# Patient Record
Sex: Male | Born: 1970
Health system: Southern US, Community
[De-identification: ages and names within clinical notes are randomized; demographics above are authoritative.]

---

## 2011-10-12 DIAGNOSIS — IMO0002 Reserved for concepts with insufficient information to code with codable children: Secondary | ICD-10-CM | POA: Insufficient documentation

## 2011-11-17 DIAGNOSIS — E782 Mixed hyperlipidemia: Secondary | ICD-10-CM | POA: Insufficient documentation

## 2014-06-14 DIAGNOSIS — K219 Gastro-esophageal reflux disease without esophagitis: Secondary | ICD-10-CM | POA: Insufficient documentation

## 2014-06-14 DIAGNOSIS — Z87891 Personal history of nicotine dependence: Secondary | ICD-10-CM | POA: Insufficient documentation

## 2014-06-20 DIAGNOSIS — I10 Essential (primary) hypertension: Secondary | ICD-10-CM | POA: Insufficient documentation

## 2015-06-05 ENCOUNTER — Encounter: Payer: Self-pay | Admitting: Sports Medicine

## 2015-06-13 ENCOUNTER — Ambulatory Visit (INDEPENDENT_AMBULATORY_CARE_PROVIDER_SITE_OTHER): Payer: BLUE CROSS/BLUE SHIELD | Admitting: Sports Medicine

## 2015-06-13 ENCOUNTER — Ambulatory Visit (INDEPENDENT_AMBULATORY_CARE_PROVIDER_SITE_OTHER): Payer: BLUE CROSS/BLUE SHIELD

## 2015-06-13 VITALS — BP 130/84 | HR 81 | Resp 18 | Wt 231.7 lb

## 2015-06-13 DIAGNOSIS — M5416 Radiculopathy, lumbar region: Secondary | ICD-10-CM

## 2015-06-13 DIAGNOSIS — M7072 Other bursitis of hip, left hip: Secondary | ICD-10-CM | POA: Diagnosis not present

## 2015-06-13 MED ORDER — MELOXICAM 15 MG PO TABS: ORAL_TABLET | ORAL | Status: DC

## 2015-06-13 MED ORDER — PREDNISONE 50 MG PO TABS: ORAL_TABLET | ORAL | Status: DC

## 2015-06-13 NOTE — Progress Notes (Signed)
   Subjective:    I'm seeing this patient as a consultation for:  Dr. Janece CanterburyAaron Boals  CC: Back pain, left buttock pain  HPI: Right-sided back pain: Worse with sitting, flexion, Valsalva with radiation down the leg in an L5 distribution, hasn't present for years on and off. Moderate, persistent with gout constitutional symptoms, or bowel or bladder dysfunction.  Left-sided buttock pain: Worse when sitting, localized over the ischial tuberosity, moderate, persistent.  Past medical history, Surgical history, Family history not pertinant except as noted below, Social history, Allergies, and medications have been entered into the medical record, reviewed, and no changes needed.   Review of Systems: No headache, visual changes, nausea, vomiting, diarrhea, constipation, dizziness, abdominal pain, skin rash, fevers, chills, night sweats, weight loss, swollen lymph nodes, body aches, joint swelling, muscle aches, chest pain, shortness of breath, mood changes, visual or auditory hallucinations.   Objective:   General: Well Developed, well nourished, and in no acute distress.  Neuro/Psych: Alert and oriented x3, extra-ocular muscles intact, able to move all 4 extremities, sensation grossly intact. Skin: Warm and dry, no rashes noted.  Respiratory: Not using accessory muscles, speaking in full sentences, trachea midline.  Cardiovascular: Pulses palpable, no extremity edema. Abdomen: Does not appear distended. Back Exam:  Inspection: Unremarkable  Motion: Flexion 45 deg, Extension 45 deg, Side Bending to 45 deg bilaterally,  Rotation to 45 deg bilaterally  SLR laying: Negative  XSLR laying: Negative  Palpable tenderness: None. FABER: negative. Sensory change: Gross sensation intact to all lumbar and sacral dermatomes.  Reflexes: 2+ at both patellar tendons, 2+ at achilles tendons, Babinski's downgoing.  Strength at foot  Plantar-flexion: 5/5 Dorsi-flexion: 5/5 Eversion: 5/5 Inversion: 5/5  Leg  strength  Quad: 5/5 Hamstring: 5/5 Hip flexor: 5/5 Hip abductors: 5/5  Gait unremarkable. Left Hip: ROM IR: 60 Deg, ER: 60 Deg, Flexion: 120 Deg, Extension: 100 Deg, Abduction: 45 Deg, Adduction: 45 Deg Strength IR: 5/5, ER: 5/5, Flexion: 5/5, Extension: 5/5, Abduction: 5/5, Adduction: 5/5 Pelvic alignment unremarkable to inspection and palpation. Standing hip rotation and gait without trendelenburg / unsteadiness. Greater trochanter without tenderness to palpation. No tenderness over piriformis. No SI joint tenderness and normal minimal SI movement.  Tenderness over the ischial bursa  Procedure: Real-time Ultrasound Guided Injection of left ischial bursa Device: GE Logiq E  Verbal informed consent obtained.  Time-out conducted.  Noted no overlying erythema, induration, or other signs of local infection.  Skin prepped in a sterile fashion.  Local anesthesia: Topical Ethyl chloride.  With sterile technique and under real time ultrasound guidance:  Spinal needle advanced towards the skilled bursa, medication was injected near the ischial tuberosity, and somewhat more proximally in the tissue plane between the gluteus maximus and medius. Completed without difficulty  Pain immediately resolved suggesting accurate placement of the medication.  Advised to call if fevers/chills, erythema, induration, drainage, or persistent bleeding.  Images permanently stored and available for review in the ultrasound unit.  Impression: Technically successful ultrasound guided injection.  Impression and Recommendations:   This case required medical decision making of moderate complexity.

## 2015-06-13 NOTE — Assessment & Plan Note (Signed)
Prednisone, meloxicam, formal physical therapy, x-rays. Return in one month, MRI for interventional planning if no better.

## 2015-06-13 NOTE — Assessment & Plan Note (Signed)
Injection as above, return in one month. 

## 2015-06-19 ENCOUNTER — Ambulatory Visit: Payer: BLUE CROSS/BLUE SHIELD | Admitting: Physical Therapy

## 2015-06-25 ENCOUNTER — Ambulatory Visit (INDEPENDENT_AMBULATORY_CARE_PROVIDER_SITE_OTHER): Payer: BLUE CROSS/BLUE SHIELD | Admitting: Physical Therapy

## 2015-06-25 ENCOUNTER — Encounter: Payer: Self-pay | Admitting: Physical Therapy

## 2015-06-25 DIAGNOSIS — M62830 Muscle spasm of back: Secondary | ICD-10-CM | POA: Diagnosis not present

## 2015-06-25 DIAGNOSIS — M5441 Lumbago with sciatica, right side: Secondary | ICD-10-CM

## 2015-06-25 DIAGNOSIS — M25651 Stiffness of right hip, not elsewhere classified: Secondary | ICD-10-CM

## 2015-06-25 DIAGNOSIS — M6281 Muscle weakness (generalized): Secondary | ICD-10-CM | POA: Diagnosis not present

## 2015-06-25 NOTE — Therapy (Signed)
[facility] [address] [place], [place], [address] Phone: [phone]   Fax:  [phone]  Physical Therapy Evaluation  Patient Details  Name: Andre Frye MRN: [id number] Date of Birth: [birth year] Referring Provider: Dr Benjamin Stainhekkekandam  Encounter Date: 06/25/2015      PT End of Session - 06/25/15 1532    Visit Number 1   Number of Visits 8   Date for PT Re-Evaluation 07/23/15   PT Start Time 1532   PT Stop Time 1645   PT Time Calculation (min) 73 min   Activity Tolerance Patient tolerated treatment well      History reviewed. No pertinent past medical history.  History reviewed. No pertinent past surgical history.  There were no vitals filed for this visit.       Subjective Assessment - 06/25/15 1532    Subjective Pt reports he has had some falls with playing with his son during soccer and developed low back pain. the pain became progressively worse with sitting and leaning forward.  He did have some radicular symptoms into the Rt LE, had a 5 day  dose of prednisone and it helped some.  Then had an injection in the Lt hip, this pain is resolved.  He is now taking mobic and is 80% improved.Continues with low back pain.     How long can you sit comfortably? 15'   How long can you walk comfortably? not limited   Diagnostic tests x-rays show lumbar DDD   Patient Stated Goals get rid of pain, not have it return into his legs and limit his walking and driving.    Currently in Pain? Yes   Pain Score 2    Pain Location Back   Pain Orientation Mid;Lower   Pain Descriptors / Indicators Aching;Dull   Pain Type Acute pain   Pain Onset More than a month ago   Pain Frequency Constant   Aggravating Factors  sitting   Pain Relieving Factors  medication - prednisone            [facility] PT Assessment - 06/25/15 0001    Assessment   Medical Diagnosis Rt lumbar radiculopathy   Referring Provider Dr Benjamin Stainhekkekandam   Onset  Date/Surgical Date 03/28/15   Hand Dominance Right  however Lt side dominant   Next MD Visit 07/12/15   Prior Therapy saw chiropractor 2 yrs ago without relief   Precautions   Precautions None   Balance Screen   Has the patient fallen in the past 6 months Yes   How many times? 1  playing with son   Has the patient had a decrease in activity level because of a fear of falling?  No   Is the patient reluctant to leave their home because of a fear of falling?  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    Leisure gets a massage 2x/month, play with son,    Observation/Other Assessments   Focus on Therapeutic Outcomes (FOTO)  34% limited   Functional Tests   Functional tests Squat;Single leg stance   Squat   Comments WNL   Single Leg Stance   Comments WNL   Posture/Postural Control   Posture/Postural Control Postural limitations   Postural Limitations Increased lumbar lordosis  extrea abdominal girth   Posture Comments good lower body alignment    ROM / Strength  AROM / PROM / Strength AROM;Strength   AROM   Overall AROM Comments hips limited due to soft tissue approximation   AROM Assessment Site Lumbar   Lumbar Flexion WNl   Lumbar Extension WNL   Lumbar - Right Side Bend WNL   Lumbar - Left Side Bend WNL   Lumbar - Right Rotation WNL however developed side spasm   Lumbar - Left Rotation WNL however developed side spasm   Strength   Strength Assessment Site Lumbar;Hip;Knee;Ankle   Right/Left Hip --  bilat WNL   Right/Left Knee --  bilat WNL   Right/Left Ankle --  bilat WNL   Lumbar Flexion --  TA fair   Lumbar Extension --  multifidi poor   Flexibility   Soft Tissue Assessment /Muscle Length yes   Hamstrings Lt 68 degrees, Rt 65 degrees   Palpation   Spinal mobility good spinal mobility CPA & bilat UPA mobs however tender in L5-3   Palpation comment  tightness in Rt lumbar paraspinals and QL with tenderness.   Special Tests    Special Tests --  (-) lumbar special tests                   [facility] Adult PT Treatment/Exercise - 06/25/15 0001    Exercises   Exercises Lumbar   Lumbar Exercises: Stretches   Passive Hamstring Stretch 2 reps;30 seconds  with strap bilat   Lower Trunk Rotation --  10 reps to each side   Lumbar Exercises: Supine   Ab Set 10 reps;5 seconds  VC required   Lumbar Exercises: Prone   Other Prone Lumbar Exercises 10 reps pelvic press, VC required.    Modalities   Modalities Electrical Stimulation;Moist Heat   Moist Heat Therapy   Number Minutes Moist Heat 15 Minutes   Moist Heat Location Lumbar Spine   Electrical Stimulation   Electrical Stimulation Location lumbar   Electrical Stimulation Action IFC   Electrical Stimulation Parameters  to tolerance   Electrical Stimulation Goals Pain;Tone                PT Education - 06/25/15 1640    Education provided Yes   Education Details HEP, TDN   Person(s) Educated Patient   Methods Explanation;Demonstration;Handout   Comprehension Need further instruction;Verbalized understanding             PT Long Term Goals - 06/25/15 1654    PT LONG TERM GOAL #1   Title I with advanced HEP for core stability ( 07/23/15)    Time 4   Period Weeks   Status New   PT LONG TERM GOAL #2   Title demo good contraction of TA and multifidi ( 07/23/15)    Time 4   Period Weeks   Status New   PT LONG TERM GOAL #3   Title report pain decrease =/> 75% with sitting ( 07/23/15)    Time 4   Period Weeks   Status New   PT LONG TERM GOAL #4   Title improved FOTO =/< 23% limited ( 07/23/15)    Time 4   Period Weeks   Status New               Plan - 06/25/15 1640    Clinical Impression Statement 45 yo male with Rt radicular LBP for 4-5 months after falling presents with core weakness, hamstring tightness, increased lumbar lordosis and tightness in  his back musculature. 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 He had difficulty engaging his deep core muscles with therapeutic exercise.    Rehab Potential Good   PT Frequency 2x / week   PT Duration 4 weeks   PT Treatment/Interventions Ultrasound;Neuromuscular re-education;Patient/family education;Cryotherapy;Dry needling;Electrical Stimulation;Moist Heat;Therapeutic exercise;Manual techniques   PT Next Visit Plan progress core ex, TDN Rt OL/lumbar PRN      Patient will benefit from skilled therapeutic intervention in order to improve the following deficits and impairments:  Postural dysfunction, Decreased strength, Pain, Obesity, Increased muscle spasms  Visit Diagnosis: Midline low back pain with right-sided sciatica - Plan: PT plan of care cert/re-cert  Muscle weakness (generalized) - Plan: PT plan of care cert/re-cert  Stiffness of right hip, not elsewhere classified - Plan: PT plan of care cert/re-cert  Muscle spasm of back - Plan: PT plan of care cert/re-cert     Problem List Patient Active Problem List   Diagnosis Date Noted  . Right lumbar radiculopathy 06/13/2015  . Ischial bursitis of left side 06/13/2015    Roderic Scarce PT 06/25/2015, 4:58 PM  [facility] [address] [place], [place], [address] Phone: [phone]   Fax:  [phone]  Name: Andre Frye MRN: [id number] Date of Birth: 01-Jan-1971

## 2015-06-25 NOTE — Patient Instructions (Signed)
Supine: Leg Stretch with Strap (Super Advanced)    Lie on back with one leg straight. Hook strap around other foot. Straighten knee. Raise leg to maximal stretch and straighten knee further by tightening quadriceps. Slowly press other leg down as close to floor as possible. Keep lower abdominals tight. Hold _30__ seconds or more. Warning: Intense stretch. Stay within tolerance. Repeat _2__ times per session. Do __2_ sessions per day. Repeat on the other leg.   Lower Trunk Rotation Stretch    Keeping back flat and feet together, rotate knees to left side. Hold _3-5___ seconds. Repeat _10___ times per set. Do __1__ sets per session. Do __2__ sessions per day.  Pelvic Tilt    Flatten back by tightening stomach muscles and hold 5 sec. Repeat __10__ times per set. Do _1___ sets per session. Do _2___ sessions per day.   Pelvic Press    Place hands under belly between navel and pubic bone, palms up. Feel pressure on hands. Increase pressure on hands by pressing pelvis down. This is NOT a pelvic tilt. Hold _5__ seconds. Relax. Repeat _10__ times. 2 times a day.   Copyright  VHI. All rights reserved.   Trigger Point Dry Needling  . What is Trigger Point Dry Needling (DN)? o DN is a physical therapy technique used to treat muscle pain and dysfunction. Specifically, DN helps deactivate muscle trigger points (muscle knots).  o A thin filiform needle is used to penetrate the skin and stimulate the underlying trigger point. The goal is for a local twitch response (LTR) to occur and for the trigger point to relax. No medication of any kind is injected during the procedure.   . What Does Trigger Point Dry Needling Feel Like?  o The procedure feels different for each individual patient. Some patients report that they do not actually feel the needle enter the skin and overall the process is not painful. Very mild bleeding may occur. However, many patients feel a deep cramping in the muscle in  which the needle was inserted. This is the local twitch response.   Marland Kitchen. How Will I feel after the treatment? o Soreness is normal, and the onset of soreness may not occur for a few hours. Typically this soreness does not last longer than two days.  o Bruising is uncommon, however; ice can be used to decrease any possible bruising.  o In rare cases feeling tired or nauseous after the treatment is normal. In addition, your symptoms may get worse before they get better, this period will typically not last longer than 24 hours.   . What Can I do After My Treatment? o Increase your hydration by drinking more water for the next 24 hours. o You may place ice or heat on the areas treated that have become sore, however, do not use heat on inflamed or bruised areas. Heat often brings more relief post needling. o You can continue your regular activities, but vigorous activity is not recommended initially after the treatment for 24 hours. o DN is best combined with other physical therapy such as strengthening, stretching, and other therapies.

## 2015-06-26 ENCOUNTER — Ambulatory Visit: Payer: Self-pay | Admitting: Rehabilitative and Restorative Service Providers"

## 2015-07-02 ENCOUNTER — Ambulatory Visit (INDEPENDENT_AMBULATORY_CARE_PROVIDER_SITE_OTHER): Payer: BLUE CROSS/BLUE SHIELD | Admitting: Physical Therapy

## 2015-07-02 DIAGNOSIS — M5441 Lumbago with sciatica, right side: Secondary | ICD-10-CM

## 2015-07-02 DIAGNOSIS — M6281 Muscle weakness (generalized): Secondary | ICD-10-CM | POA: Diagnosis not present

## 2015-07-02 DIAGNOSIS — M25651 Stiffness of right hip, not elsewhere classified: Secondary | ICD-10-CM | POA: Diagnosis not present

## 2015-07-02 DIAGNOSIS — M62830 Muscle spasm of back: Secondary | ICD-10-CM | POA: Diagnosis not present

## 2015-07-02 NOTE — Patient Instructions (Addendum)
Outer Hip Stretch: Reclined IT Band Stretch (Strap)  [place] [phone]    Strap around right foot, lift leg up then pull across only as far as possible with shoulders on mat. Rotate the hips Hold for _30-45_ secs. Repeat __2__ times each leg.  Copyright  VHI. All rights reserved.                                TENS UNIT: This is helpful for muscle pain and spasm.   Search and Purchase a TENS 7000 2nd edition at www.tenspros.com. It should be less than $30.     TENS unit instructions: Do not shower or bathe with the unit on Turn the unit off before removing electrodes or batteries If the electrodes lose stickiness add a drop of water to the electrodes after they are disconnected from the unit and place on plastic sheet. If you continued to have difficulty, call the TENS unit company to purchase more electrodes. Do not apply lotion on the skin area prior to use. Make sure the skin is clean and dry as this will help prolong the life of the electrodes. After use, always check skin for unusual red areas, rash or other skin difficulties. If there are any skin problems, does not apply electrodes to the same area. Never remove the electrodes from the unit by pulling the wires. Do not use the TENS unit or electrodes other than as directed. Do not change electrode placement without consultating your therapist or physician. Keep 2 fingers with between each electrode. Wear time ratio is 2:1, on to off times.    For example on for 30 minutes off for 15 minutes and then on for 30 minutes off for 15 minutes

## 2015-07-02 NOTE — Therapy (Signed)
[facility] [address] [place], [place], [address] Phone: [phone]   Fax:  [phone]  Physical Therapy Treatment  Patient Details  Name: Andre Frye MRN: [id number] Date of Birth: 19-Jan-1971 Referring Provider: Dr Benjamin Stain  Encounter Date: 07/02/2015      PT End of Session - 07/02/15 1608    Visit Number 2   Number of Visits 8   Date for PT Re-Evaluation 07/23/15   PT Start Time 1603   PT Stop Time 1652   PT Time Calculation (min) 49 min   Activity Tolerance Patient tolerated treatment well      No past medical history on file.  No past surgical history on file.  There were no vitals filed for this visit.      Subjective Assessment - 07/02/15 1606    Subjective Pt reports he is doing ok, , no trouble with HEP. Did a lot of swimming, walking on the beach   Patient Stated Goals get rid of pain, not have it return into his legs and limit his walking and driving.    Currently in Pain? Yes   Pain Score 2    Pain Location Back   Pain Orientation Right   Pain Descriptors / Indicators Aching   Pain Type Acute pain   Pain Onset More than a month ago   Pain Frequency Constant   Aggravating Factors  sitting - however getting better   Pain Relieving Factors medication                          [facility] Adult PT Treatment/Exercise - 07/02/15 0001    Lumbar Exercises: Stretches   Single Knee to Chest Stretch 1 rep;20 seconds  each side   ITB Stretch --  cross body with strap Rt LE   Lumbar Exercises: Aerobic   Stationary Bike L2x5'   Lumbar Exercises: Supine   Isometric Hip Flexion 10 reps;5 seconds  same side and opposite side.    Lumbar Exercises: Prone   Opposite Arm/Leg Raise Right arm/Left leg;Left arm/Right leg;10 reps  with pelvic press   Other Prone Lumbar Exercises 10 reps knee flex/ext with pelvic press   Modalities   Modalities Electrical Stimulation;Moist Heat   Moist  Heat Therapy   Number Minutes Moist Heat 15 Minutes   Moist Heat Location Lumbar Spine   Electrical Stimulation   Electrical Stimulation Location lumbar   Electrical Stimulation Action IFC   Electrical Stimulation Parameters  to tolerance   Electrical Stimulation Goals Pain;Tone   Manual Therapy   Manual Therapy Soft tissue mobilization;Joint mobilization   Joint Mobilization lumbar spine L3-5 CPA Rt UPA grade III+   Soft tissue mobilization Rt lower back L5 area then ball release work to the same area.                      PT Long Term Goals - 07/02/15 1625    PT LONG TERM GOAL #1   Title I with advanced HEP for core stability ( 07/23/15)    Time 4   Period Weeks   Status On-going   PT LONG TERM GOAL #2   Title demo good contraction of TA and multifidi ( 07/23/15)    Time 4   Period Weeks   Status On-going   PT LONG TERM GOAL #3   Title report pain decrease =/> 75% with sitting ( 07/23/15)    Time 4  Period Weeks   Status On-going   PT LONG TERM GOAL #4   Title improved FOTO =/< 23% limited ( 07/23/15)    Time 4   Period Weeks   Status On-going               Plan - 07/02/15 1642    Clinical Impression Statement This is Ananth's second visit.  He has some reduction in pain however still has discomfort. He has difficulty cordinating some of the core exercise when there is a focus on specific muscles.    Rehab Potential Good   PT Frequency 2x / week   PT Duration 4 weeks   PT Treatment/Interventions Ultrasound;Neuromuscular re-education;Patient/family education;Cryotherapy;Dry needling;Electrical Stimulation;Moist Heat;Therapeutic exercise;Manual techniques   PT Next Visit Plan progress core   Consulted and Agree with Plan of Care Patient      Patient will benefit from skilled therapeutic intervention in order to improve the following deficits and impairments:  Postural dysfunction, Decreased strength, Pain, Obesity, Increased muscle spasms  Visit  Diagnosis: Muscle spasm of back  Stiffness of right hip, not elsewhere classified  Muscle weakness (generalized)  Midline low back pain with right-sided sciatica     Problem List Patient Active Problem List   Diagnosis Date Noted  . Right lumbar radiculopathy 06/13/2015  . Ischial bursitis of left side 06/13/2015    Roderic ScarceSusan Mekiah Wahler PT 07/02/2015, 4:47 PM  [facility] [address] [place], [place], [address] Phone: [phone]   Fax:  [phone]  Name: Andre Frye MRN: [id number] Date of Birth: September 09, 1970

## 2015-07-03 ENCOUNTER — Encounter: Payer: Self-pay | Admitting: Rehabilitative and Restorative Service Providers"

## 2015-07-03 ENCOUNTER — Ambulatory Visit (INDEPENDENT_AMBULATORY_CARE_PROVIDER_SITE_OTHER): Payer: BLUE CROSS/BLUE SHIELD | Admitting: Rehabilitative and Restorative Service Providers"

## 2015-07-03 DIAGNOSIS — M6281 Muscle weakness (generalized): Secondary | ICD-10-CM | POA: Diagnosis not present

## 2015-07-03 DIAGNOSIS — M5441 Lumbago with sciatica, right side: Secondary | ICD-10-CM

## 2015-07-03 DIAGNOSIS — M25651 Stiffness of right hip, not elsewhere classified: Secondary | ICD-10-CM | POA: Diagnosis not present

## 2015-07-03 DIAGNOSIS — M62830 Muscle spasm of back: Secondary | ICD-10-CM | POA: Diagnosis not present

## 2015-07-03 NOTE — Therapy (Signed)
[facility] [place] [address] [place], [place], [address] Phone: [phone]   Fax:  [phone]  Physical Therapy Treatment  Patient Details  Name: Andre Frye MRN: [id number] Date of Birth: January 26, 1971 Referring Provider: Dr Benjamin Stain  Encounter Date: 07/03/2015      PT End of Session - 07/03/15 1543    Visit Number 3   Number of Visits 8   Date for PT Re-Evaluation 07/23/15   PT Start Time 1539  pt arrived 8 min late for appt   PT Stop Time 1624   PT Time Calculation (min) 45 min   Activity Tolerance Patient tolerated treatment well      History reviewed. No pertinent past medical history.  History reviewed. No pertinent past surgical history.  There were no vitals filed for this visit.      Subjective Assessment - 07/03/15 1542    Subjective continued improvement - less pain   Currently in Pain? No/denies   Pain Score 2    Pain Location Back   Pain Orientation Right   Pain Descriptors / Indicators Aching   Pain Type Acute pain   Pain Onset More than a month ago   Pain Frequency Intermittent                         [facility] Adult PT Treatment/Exercise - 07/03/15 0001    Exercises   Exercises Lumbar   Lumbar Exercises: Stretches   Passive Hamstring Stretch 2 reps;30 seconds  with strap; bilat    ITB Stretch --  cross body with strap Rt LE   Lumbar Exercises: Aerobic   Stationary Bike Nustep L5 x 5 min    Lumbar Exercises: Supine   Ab Set --  attempted 3 part core - difficulty coordinating/breathing   Clam 10 reps  working on Scientist, water quality Knee Raise 10 reps  with core engaged   Lumbar Exercises: Prone   Opposite Arm/Leg Raise Right arm/Left leg;Left arm/Right leg;10 reps  with pelvic press   Other Prone Lumbar Exercises 10 reps knee flex/ext with pelvic press   Modalities   Modalities Electrical Stimulation;Moist Heat   Moist Heat Therapy   Number Minutes Moist Heat  15 Minutes   Moist Heat Location Lumbar Spine   Electrical Stimulation   Electrical Stimulation Location lumbar   Electrical Stimulation Action IFC   Electrical Stimulation Parameters to tolerance   Electrical Stimulation Goals Pain;Tone   Manual Therapy   Manual Therapy Soft tissue mobilization;Joint mobilization   Joint Mobilization lumbar spine L3-5 CPA Rt UPA grade III+   Soft tissue mobilization Rt lower back L5 area then ball release work to the same area.                      PT Long Term Goals - 07/02/15 1625    PT LONG TERM GOAL #1   Title I with advanced HEP for core stability ( 07/23/15)    Time 4   Period Weeks   Status On-going   PT LONG TERM GOAL #2   Title demo good contraction of TA and multifidi ( 07/23/15)    Time 4   Period Weeks   Status On-going   PT LONG TERM GOAL #3   Title report pain decrease =/> 75% with sitting ( 07/23/15)    Time 4   Period Weeks   Status On-going   PT LONG TERM GOAL #4  Title improved FOTO =/< 23% limited ( 07/23/15)    Time 4   Period Weeks   Status On-going               Plan - 07/03/15 1544    Clinical Impression Statement Good response to initial treatments. Decreasing pain and improving mobility and activity level. Some continued difficulty with core stability/strength. Tends to hold his breath. Progressing well toward stated goals of therapy.    Rehab Potential Good   PT Frequency 2x / week   PT Duration 4 weeks   PT Treatment/Interventions Ultrasound;Neuromuscular re-education;Patient/family education;Cryotherapy;Dry needling;Electrical Stimulation;Moist Heat;Therapeutic exercise;Manual techniques   PT Next Visit Plan progress core   Consulted and Agree with Plan of Care Patient      Patient will benefit from skilled therapeutic intervention in order to improve the following deficits and impairments:  Postural dysfunction, Decreased strength, Pain, Obesity, Increased muscle spasms  Visit  Diagnosis: Muscle spasm of back  Stiffness of right hip, not elsewhere classified  Muscle weakness (generalized)  Midline low back pain with right-sided sciatica     Problem List Patient Active Problem List   Diagnosis Date Noted  . Right lumbar radiculopathy 06/13/2015  . Ischial bursitis of left side 06/13/2015    Andre Frye PT, MPH  07/03/2015, 4:19 PM  [facility] [address] [place], [place], [address] Phone: [phone]   Fax:  [phone]  Name: Andre Frye MRN: [id number] Date of Birth: 1970/09/01

## 2015-07-08 ENCOUNTER — Ambulatory Visit (INDEPENDENT_AMBULATORY_CARE_PROVIDER_SITE_OTHER): Payer: BLUE CROSS/BLUE SHIELD | Admitting: Physical Therapy

## 2015-07-08 DIAGNOSIS — M5441 Lumbago with sciatica, right side: Secondary | ICD-10-CM | POA: Diagnosis not present

## 2015-07-08 DIAGNOSIS — M6281 Muscle weakness (generalized): Secondary | ICD-10-CM | POA: Diagnosis not present

## 2015-07-08 DIAGNOSIS — M62830 Muscle spasm of back: Secondary | ICD-10-CM | POA: Diagnosis not present

## 2015-07-08 DIAGNOSIS — M25651 Stiffness of right hip, not elsewhere classified: Secondary | ICD-10-CM

## 2015-07-08 NOTE — Therapy (Signed)
[place], [place], [address] Phone: [phone]   Fax:  [phone]  Physical Therapy Treatment  Patient Details  Name: Andre Frye MRN: [id number] Date of Birth: [birth year] Referring Provider: Dr. Helane Rima  Encounter Date: 07/08/2015      PT End of Session - 07/08/15 0853    Visit Number 4   Number of Visits 8   Date for PT Re-Evaluation 07/23/15   PT Start Time 0849   PT Stop Time 0944   PT Time Calculation (min) 55 min      No past medical history on file.  No past surgical history on file.  There were no vitals filed for this visit.      Subjective Assessment - 07/08/15 0855    Subjective Pt reports he was putting away boxes at restaurant and had increased back pain (up to 4/10), resolved with sleep.    Pt has ordered a TENS unit, has not arrived yet.    Patient Stated Goals get rid of pain, not have it return into his legs and limit his walking and driving.    Currently in Pain? No/denies            [facility] PT Assessment - 07/08/15 0001    Assessment   Medical Diagnosis Rt lumbar radiculopathy   Referring Provider Dr. Helane Rima   Onset Date/Surgical Date 03/28/15   Hand Dominance Right   Next MD Visit 07/11/15   Prior Therapy saw chiropractor 2 yrs ago without relief   Flexibility   Hamstrings Rt 75 deg, Lt 60 deg          [facility] Adult PT Treatment/Exercise - 07/08/15 0001    Lumbar Exercises: Stretches   Passive Hamstring Stretch 2 reps;30 seconds  with strap; bilat    ITB Stretch 2 reps;30 seconds  each leg, tactile cues for improved form.    Lumbar Exercises: Aerobic   Stationary Bike Nustep L5 x 5 min (arms and legs)   Lumbar Exercises: Standing   Other Standing Lumbar Exercises sit to stand with core engaged x 3 reps;  Standing arms out/in to center with 5# weight and core engaged x 5 reps (VC for breath and form)    Lumbar Exercises: Supine   Ab Set 10  reps;5 seconds  counting out loud to encourage breath   AB Set Limitations tactile/VC for proper engagement   Clam 10 reps  working on engaging core   Bent Knee Raise 10 reps  with core engaged   Bent Knee Raise Limitations (demonstration provided for improved form.)   Isometric Hip Flexion 5 seconds;15 reps  same side and opposite side.    Isometric Hip Flexion Limitations VC for breathing during exertion   Modalities   Modalities Electrical Stimulation   Moist Heat Therapy   Number Minutes Moist Heat 15 Minutes   Moist Heat Location Lumbar Spine   Electrical Stimulation   Electrical Stimulation Location Lumbar paraspinals    Electrical Stimulation Action IFC   Electrical Stimulation Parameters to tolerance    Electrical Stimulation Goals Pain;Tone                     PT Long Term Goals - 07/08/15 0919    PT LONG TERM GOAL #1   Title I with advanced HEP for core stability ( 07/23/15)    Time 4   Period Weeks   Status On-going   PT LONG TERM GOAL #2  Title demo good contraction of TA and multifidi ( 07/23/15)    Time 4   Period Weeks   Status On-going   PT LONG TERM GOAL #3   Title report pain decrease =/> 75% with sitting ( 07/23/15)    Time 4   Period Weeks   Status Partially Met  70% reduction reported 07/08/15   PT LONG TERM GOAL #4   Title improved FOTO =/< 23% limited ( 07/23/15)    Time 4   Period Weeks   Status On-going               Plan - 07/08/15 0917    Clinical Impression Statement Pt demonstrated improved transverse abd engagement with demonstration and tactile cues.  VC to encourage breathing through exercise.  Decreased pain with sitting; partially met LTG # 3. Improving hamstring flexibility.  Progressing towards goals.     Rehab Potential Good   PT Frequency 2x / week   PT Duration 4 weeks   PT Treatment/Interventions Ultrasound;Neuromuscular re-education;Patient/family education;Cryotherapy;Dry needling;Electrical  Stimulation;Moist Heat;Therapeutic exercise;Manual techniques   PT Next Visit Plan progress core. MD note - pt returns 07/11/15   Consulted and Agree with Plan of Care Patient      Patient will benefit from skilled therapeutic intervention in order to improve the following deficits and impairments:  Postural dysfunction, Decreased strength, Pain, Obesity, Increased muscle spasms  Visit Diagnosis: Muscle spasm of back  Stiffness of right hip, not elsewhere classified  Muscle weakness (generalized)  Midline low back pain with right-sided sciatica     Problem List Patient Active Problem List   Diagnosis Date Noted  . Right lumbar radiculopathy 06/13/2015  . Ischial bursitis of left side 06/13/2015   Kerin Perna, PTA 07/08/2015 9:31 AM  [facility] [place], [place], [address] Phone: [phone]   Fax:  [phone]  Name: Andre Frye MRN: [id number] Date of Birth: 05-Jul-1970

## 2015-07-10 ENCOUNTER — Ambulatory Visit (INDEPENDENT_AMBULATORY_CARE_PROVIDER_SITE_OTHER): Payer: BLUE CROSS/BLUE SHIELD | Admitting: Physical Therapy

## 2015-07-10 ENCOUNTER — Encounter: Payer: Self-pay | Admitting: Physical Therapy

## 2015-07-10 DIAGNOSIS — M62830 Muscle spasm of back: Secondary | ICD-10-CM

## 2015-07-10 DIAGNOSIS — M5441 Lumbago with sciatica, right side: Secondary | ICD-10-CM | POA: Diagnosis not present

## 2015-07-10 DIAGNOSIS — M6281 Muscle weakness (generalized): Secondary | ICD-10-CM

## 2015-07-10 DIAGNOSIS — M25651 Stiffness of right hip, not elsewhere classified: Secondary | ICD-10-CM

## 2015-07-10 NOTE — Therapy (Addendum)
[place] [address] [place], [place], [address] Phone: [phone]   Fax:  [phone]  Physical Therapy Treatment  Patient Details  Name: Andre Frye MRN: [id number] Date of Birth: 07-Aug-1970 Referring Provider: Dr Dianah Field  Encounter Date: 07/10/2015      PT End of Session - 07/10/15 0856    Visit Number 5   Number of Visits 8   Date for PT Re-Evaluation 07/23/15   PT Start Time 0853   PT Stop Time 0941   PT Time Calculation (min) 48 min   Activity Tolerance Patient tolerated treatment well      History reviewed. No pertinent past medical history.  History reviewed. No pertinent past surgical history.  There were no vitals filed for this visit.      Subjective Assessment - 07/10/15 0855    Subjective Pt sees the MD tomorrow, still having Rt sided low back pain    Currently in Pain? Yes   Pain Score 3    Pain Location Back   Pain Orientation Right   Pain Descriptors / Indicators Aching   Pain Type Acute pain   Pain Onset More than a month ago   Pain Frequency Intermittent            [facility] PT Assessment - 07/10/15 0001    Assessment   Medical Diagnosis Rt lumbar radiculopathy   Referring Provider Dr Dianah Field   Onset Date/Surgical Date 03/28/15   Next MD Visit 07/11/15   Flexibility   Hamstrings Rt 75 deg, Lt 60 deg   Palpation   Palpation comment tightness in Rt QL, gluts and low back                     [facility] Adult PT Treatment/Exercise - 07/10/15 0001    Lumbar Exercises: Stretches   Single Knee to Chest Stretch 5 reps  5 sec    Double Knee to Chest Stretch 1 rep;10 seconds   Lower Trunk Rotation 5 reps   Lumbar Exercises: Aerobic   Stationary Bike Nustep L5 x 5 min (arms and legs)   Modalities   Modalities Electrical Stimulation;Moist Heat   Moist Heat Therapy   Number Minutes Moist Heat 15 Minutes   Moist Heat Location Lumbar Spine   Electrical  Stimulation   Electrical Stimulation Location Lumbar paraspinals   and Rt gluts   Electrical Stimulation Action IFC   Electrical Stimulation Parameters to tolerance   Electrical Stimulation Goals Pain;Tone   Manual Therapy   Manual Therapy Soft tissue mobilization   Soft tissue mobilization Rt low back, gluts, piriformis, tightness decreased with work          Trigger [employer] Needling - 07/10/15 0905    Consent Given? Yes   Education Handout Provided Yes   Muscles Treated Upper Body Quadratus Lumborum;Gluteus minimus;Gluteus maximus;Piriformis  Rt sdie with good twtich response                   PT Long Term Goals - 07/08/15 0919    PT LONG TERM GOAL #1   Title I with advanced HEP for core stability ( 07/23/15)    Time 4   Period Weeks   Status On-going   PT LONG TERM GOAL #2   Title demo good contraction of TA and multifidi ( 07/23/15)    Time 4   Period Weeks   Status On-going   PT LONG TERM GOAL #3   Title report pain  decrease =/> 75% with sitting ( 07/23/15)    Time 4   Period Weeks   Status Partially Met  70% reduction reported 07/08/15   PT LONG TERM GOAL #4   Title improved FOTO =/< 23% limited ( 07/23/15)    Time 4   Period Weeks   Status On-going               Plan - 07/10/15 0926    Clinical Impression Statement Pt making slow progress with pain reduction.  He has good response to TDN today in the Rt low back/buttocks.  His pain was decreased after this and he had increased mobility.  Hamstrings are slowly lenghtening. He would benefti from a couple more treatments to further reduce ms spasms and learn core exercise. He has difficulty with these.    Rehab Potential Good   PT Frequency 2x / week   PT Duration 4 weeks   PT Treatment/Interventions Ultrasound;Neuromuscular re-education;Patient/family education;Cryotherapy;Dry needling;Electrical Stimulation;Moist Heat;Therapeutic exercise;Manual techniques   PT Next Visit Plan assess response  to TDN   Consulted and Agree with Plan of Care Patient      Patient will benefit from skilled therapeutic intervention in order to improve the following deficits and impairments:  Postural dysfunction, Decreased strength, Pain, Obesity, Increased muscle spasms  Visit Diagnosis: Muscle spasm of back  Stiffness of right hip, not elsewhere classified  Muscle weakness (generalized)  Midline low back pain with right-sided sciatica     Problem List Patient Active Problem List   Diagnosis Date Noted  . Right lumbar radiculopathy 06/13/2015  . Ischial bursitis of left side 06/13/2015    Jeral Pinch PT  07/10/2015, 9:29 AM  [facility] [place], [place], [address] Phone: [phone]   Fax:  [phone]  Name: Andre Frye MRN: [id number] Date of Birth: [birth year]    PHYSICAL THERAPY DISCHARGE SUMMARY  Visits from Start of Care: 5  Current functional level related to goals / functional outcomes: unknown   Remaining deficits: unknown   Education / Equipment: Initial HEP  Plan:                                                    Patient goals were not met. Patient is being discharged due to not returning since the last visit.  ?????     Jeral Pinch, PT 08/07/2015 10:11 AM

## 2015-07-11 ENCOUNTER — Ambulatory Visit (INDEPENDENT_AMBULATORY_CARE_PROVIDER_SITE_OTHER): Payer: BLUE CROSS/BLUE SHIELD | Admitting: Sports Medicine

## 2015-07-11 VITALS — BP 127/83 | HR 75 | Resp 18 | Wt 228.4 lb

## 2015-07-11 DIAGNOSIS — M7072 Other bursitis of hip, left hip: Secondary | ICD-10-CM

## 2015-07-11 DIAGNOSIS — M5416 Radiculopathy, lumbar region: Secondary | ICD-10-CM | POA: Diagnosis not present

## 2015-07-11 MED ORDER — PREDNISONE 10 MG (21) PO TBPK: ORAL_TABLET | ORAL | Status: DC

## 2015-07-11 NOTE — Assessment & Plan Note (Signed)
Completely resolved after injection 

## 2015-07-11 NOTE — Progress Notes (Signed)
  Subjective:    CC: Followup  HPI: Ischial bursitis: Resolved after injection.  Low back pain: Improved approximately 70% with prednisone, meloxicam, physical therapy. Still has a pain when sitting and with forward flexion as well as with lifting things, minimal radicular symptoms now, very happy with how things are going however pain relief has plateaued. No bowel or bladder dysfunction, saddle numbness, no constitutional symptoms.  Past medical history, Surgical history, Family history not pertinant except as noted below, Social history, Allergies, and medications have been entered into the medical record, reviewed, and no changes needed.   Review of Systems: No fevers, chills, night sweats, weight loss, chest pain, or shortness of breath.   Objective:    General: Well Developed, well nourished, and in no acute distress.  Neuro: Alert and oriented x3, extra-ocular muscles intact, sensation grossly intact.  HEENT: Normocephalic, atraumatic, pupils equal round reactive to light, neck supple, no masses, no lymphadenopathy, thyroid nonpalpable.  Skin: Warm and dry, no rashes. Cardiac: Regular rate and rhythm, no murmurs rubs or gallops, no lower extremity edema.  Respiratory: Clear to auscultation bilaterally. Not using accessory muscles, speaking in full sentences. Back Exam:  Inspection: Unremarkable  Motion: Flexion 45 deg, Extension 45 deg, Side Bending to 45 deg bilaterally,  Rotation to 45 deg bilaterally  SLR laying: Negative  XSLR laying: Negative  Palpable tenderness: left and right paralumbar muscles. FABER: negative. Sensory change: Gross sensation intact to all lumbar and sacral dermatomes.  Reflexes: 2+ at both patellar tendons, 2+ at achilles tendons, Babinski's downgoing.  Strength at foot  Plantar-flexion: 5/5 Dorsi-flexion: 5/5 Eversion: 5/5 Inversion: 5/5  Leg strength  Quad: 5/5 Hamstring: 5/5 Hip flexor: 5/5 Hip abductors: 5/5  Gait unremarkable.  Impression  and Recommendations:

## 2015-07-11 NOTE — Assessment & Plan Note (Signed)
70% improved with physical therapy and steroids Doing a single additional course of prednisone, taper, he will continue physical therapy and we are going to obtain an MRI for interventional planning. Pain is discogenic with occasional right-sided radiculopathy.

## 2015-07-21 ENCOUNTER — Ambulatory Visit (INDEPENDENT_AMBULATORY_CARE_PROVIDER_SITE_OTHER): Payer: BLUE CROSS/BLUE SHIELD

## 2015-07-21 DIAGNOSIS — M5117 Intervertebral disc disorders with radiculopathy, lumbosacral region: Secondary | ICD-10-CM

## 2015-07-21 DIAGNOSIS — M129 Arthropathy, unspecified: Secondary | ICD-10-CM

## 2015-07-21 DIAGNOSIS — M5416 Radiculopathy, lumbar region: Secondary | ICD-10-CM

## 2015-07-21 DIAGNOSIS — M5126 Other intervertebral disc displacement, lumbar region: Secondary | ICD-10-CM

## 2015-08-08 ENCOUNTER — Ambulatory Visit: Payer: BLUE CROSS/BLUE SHIELD | Admitting: Sports Medicine

## 2015-08-11 ENCOUNTER — Ambulatory Visit (INDEPENDENT_AMBULATORY_CARE_PROVIDER_SITE_OTHER): Payer: BLUE CROSS/BLUE SHIELD | Admitting: Sports Medicine

## 2015-08-11 DIAGNOSIS — M5416 Radiculopathy, lumbar region: Secondary | ICD-10-CM

## 2015-08-11 NOTE — Progress Notes (Signed)
  Subjective:    CC: Followup  HPI: This is a pleasant 45 year old male here for followup of MRI.  He has been having right sided axial and discogenic low back pain. Moderate, persistent, he also has some mid to upper back pain.  Past medical history, Surgical history, Family history not pertinant except as noted below, Social history, Allergies, and medications have been entered into the medical record, reviewed, and no changes needed.   Review of Systems: No fevers, chills, night sweats, weight loss, chest pain, or shortness of breath.   Objective:    General: Well Developed, well nourished, and in no acute distress.  Neuro: Alert and oriented x3, extra-ocular muscles intact, sensation grossly intact.  HEENT: Normocephalic, atraumatic, pupils equal round reactive to light, neck supple, no masses, no lymphadenopathy, thyroid nonpalpable.  Skin: Warm and dry, no rashes. Cardiac: Regular rate and rhythm, no murmurs rubs or gallops, no lower extremity edema.  Respiratory: Clear to auscultation bilaterally. Not using accessory muscles, speaking in full sentences.  MRI reviewed and shows degenerative disc disease with desiccation at the L5-S1 level.  Impression and Recommendations:   I spent 25 minutes with this patient, greater than 50% was face-to-face time counseling regarding the above diagnoses

## 2015-08-11 NOTE — Assessment & Plan Note (Signed)
Predominantly axial discogenic right-sided back pain. We have already done oral steroids, therapy. Persistent pain with degenerative disc disease at the L5-S1 level. Next line we are going to proceed with a right-sided L5-S1 interlaminar epidural. Return to see me one month after the injection to evaluate response.

## 2015-08-15 DIAGNOSIS — M5136 Other intervertebral disc degeneration, lumbar region: Secondary | ICD-10-CM | POA: Insufficient documentation

## 2015-08-19 ENCOUNTER — Ambulatory Visit
Admission: RE | Admit: 2015-08-19 | Discharge: 2015-08-19 | Disposition: A | Discharge status: Home / Self Care | Payer: BLUE CROSS/BLUE SHIELD | Source: Ambulatory Visit | Attending: Sports Medicine | Admitting: Sports Medicine

## 2015-08-19 VITALS — BP 160/98 | HR 86

## 2015-08-19 DIAGNOSIS — M5416 Radiculopathy, lumbar region: Secondary | ICD-10-CM

## 2015-08-19 MED ORDER — METHYLPREDNISOLONE ACETATE 40 MG/ML INJ SUSP (RADIOLOG
120.0000 mg | Freq: Once | INTRAMUSCULAR | Status: AC
  Administered 2015-08-19: 120 mg via EPIDURAL

## 2015-08-19 MED ORDER — IOPAMIDOL (ISOVUE-M 200) INJECTION 41%
1.0000 mL | Freq: Once | INTRAMUSCULAR | Status: AC
  Administered 2015-08-19: 1 mL via EPIDURAL

## 2015-08-19 NOTE — Discharge Instructions (Signed)

## 2015-09-08 ENCOUNTER — Ambulatory Visit (INDEPENDENT_AMBULATORY_CARE_PROVIDER_SITE_OTHER): Payer: BLUE CROSS/BLUE SHIELD | Admitting: Sports Medicine

## 2015-09-08 DIAGNOSIS — M5416 Radiculopathy, lumbar region: Secondary | ICD-10-CM

## 2015-09-08 NOTE — Assessment & Plan Note (Addendum)
Essentially pain-free after L5-S1 epidural. Happy with results, return as needed for this. He will discuss weight loss medication with his PCP. We also discussed an exercise prescription.

## 2015-09-08 NOTE — Progress Notes (Signed)
  Subjective:    CC: Follow-up  HPI: This is a pleasant 45 year old male, MRI showed L5-S1 degenerative disc disease with desiccation and protrusion without overt foraminal stenosis. We proceeded with L5-S1 interlaminar epidural and he returns today essentially pain-free, only has mild recurrence here and there but feels good overall.  We did discuss his weight and he agrees to talk to his PCP regarding weight loss medication.  Past medical history, Surgical history, Family history not pertinant except as noted below, Social history, Allergies, and medications have been entered into the medical record, reviewed, and no changes needed.   Review of Systems: No fevers, chills, night sweats, weight loss, chest pain, or shortness of breath.   Objective:    General: Well Developed, well nourished, and in no acute distress.  Neuro: Alert and oriented x3, extra-ocular muscles intact, sensation grossly intact.  HEENT: Normocephalic, atraumatic, pupils equal round reactive to light, neck supple, no masses, no lymphadenopathy, thyroid nonpalpable.  Skin: Warm and dry, no rashes. Cardiac: Regular rate and rhythm, no murmurs rubs or gallops, no lower extremity edema.  Respiratory: Clear to auscultation bilaterally. Not using accessory muscles, speaking in full sentences.  Impression and Recommendations:    Right lumbar radiculopathy Essentially pain-free after L5-S1 epidural. Happy with results, return as needed for this. He will discuss weight loss medication with his PCP. We also discussed an exercise prescription.  I spent 25 minutes with this patient, greater than 50% was face-to-face time counseling regarding the above diagnoses

## 2015-10-13 ENCOUNTER — Other Ambulatory Visit: Payer: Self-pay | Admitting: Sports Medicine

## 2015-10-13 DIAGNOSIS — M5416 Radiculopathy, lumbar region: Secondary | ICD-10-CM

## 2017-10-06 IMAGING — DX DG LUMBAR SPINE COMPLETE 4+V
5 series · 5 of 5 positions shown · non-contrast
Comparison: None.

CLINICAL DATA: Right lumbar radiculopathy for about 6 months, no
known injury, worsening symptoms when sitting and driving

EXAM:
LUMBAR SPINE - COMPLETE 4+ VIEW

[l-spine ap]
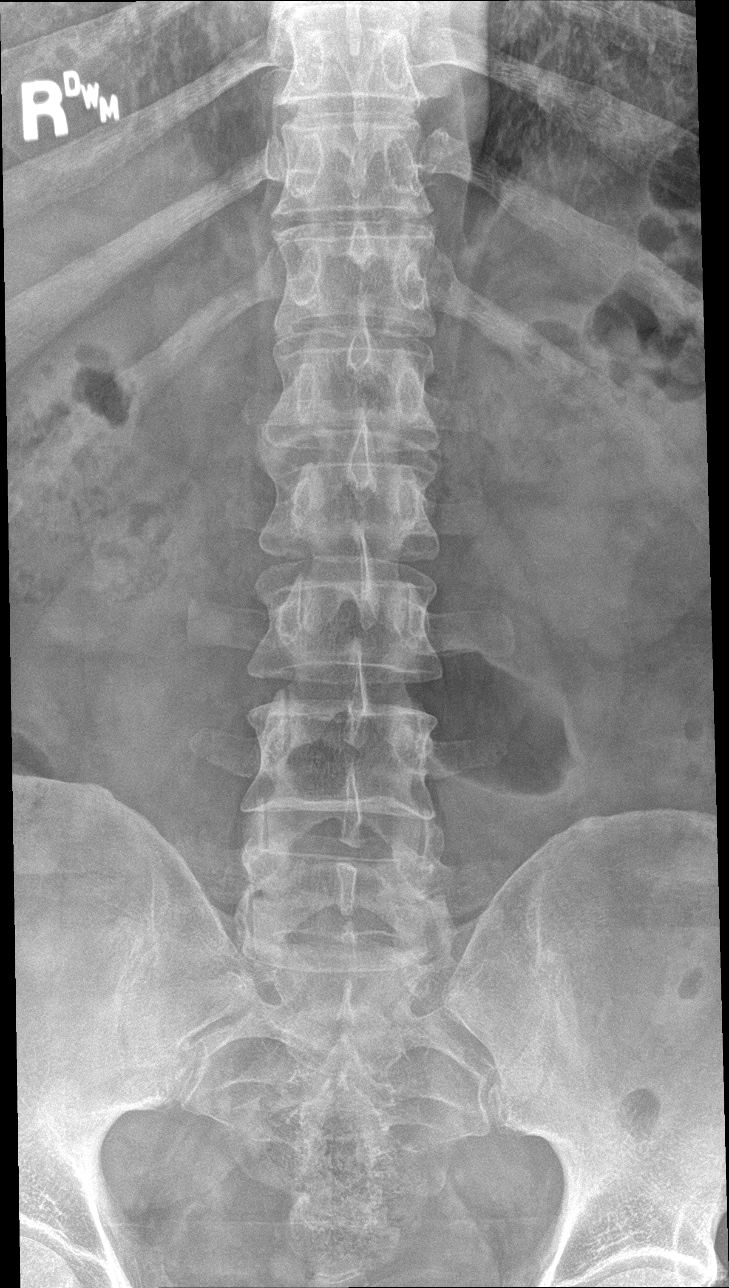

[l-spine obl (1 of 2)]
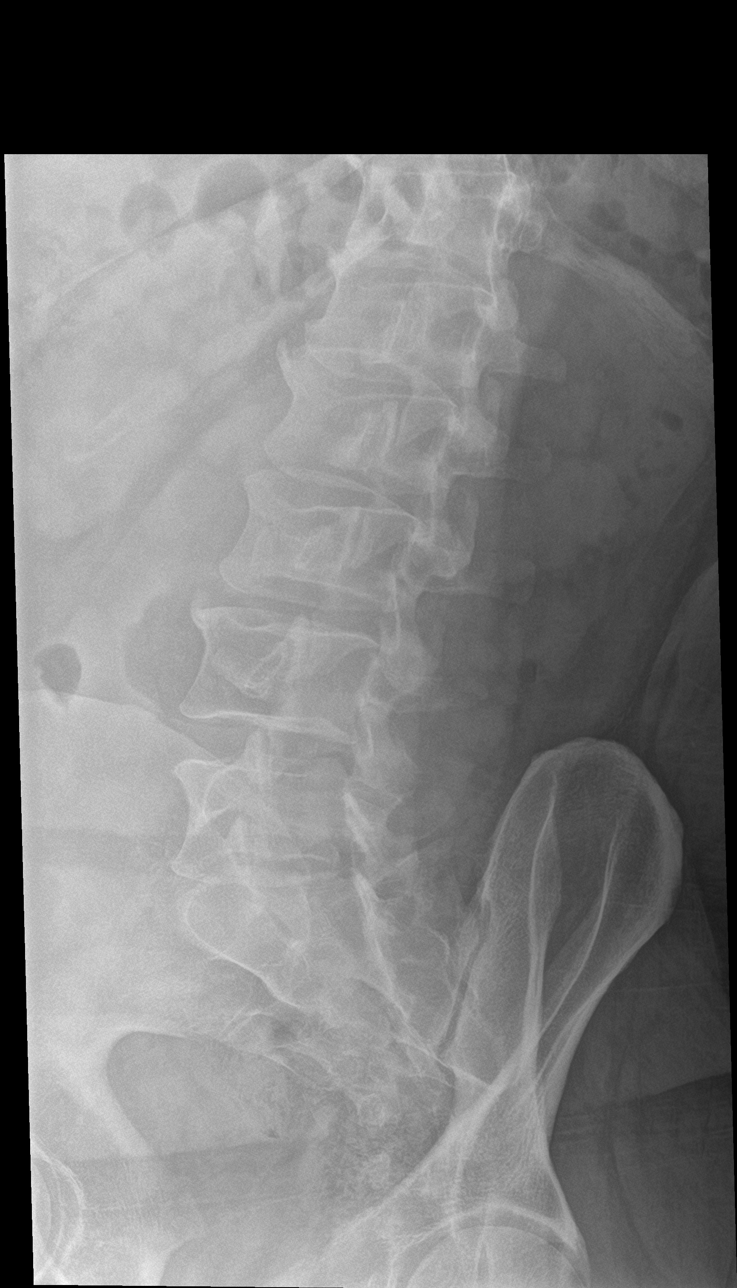

[l-spine obl (2 of 2)]
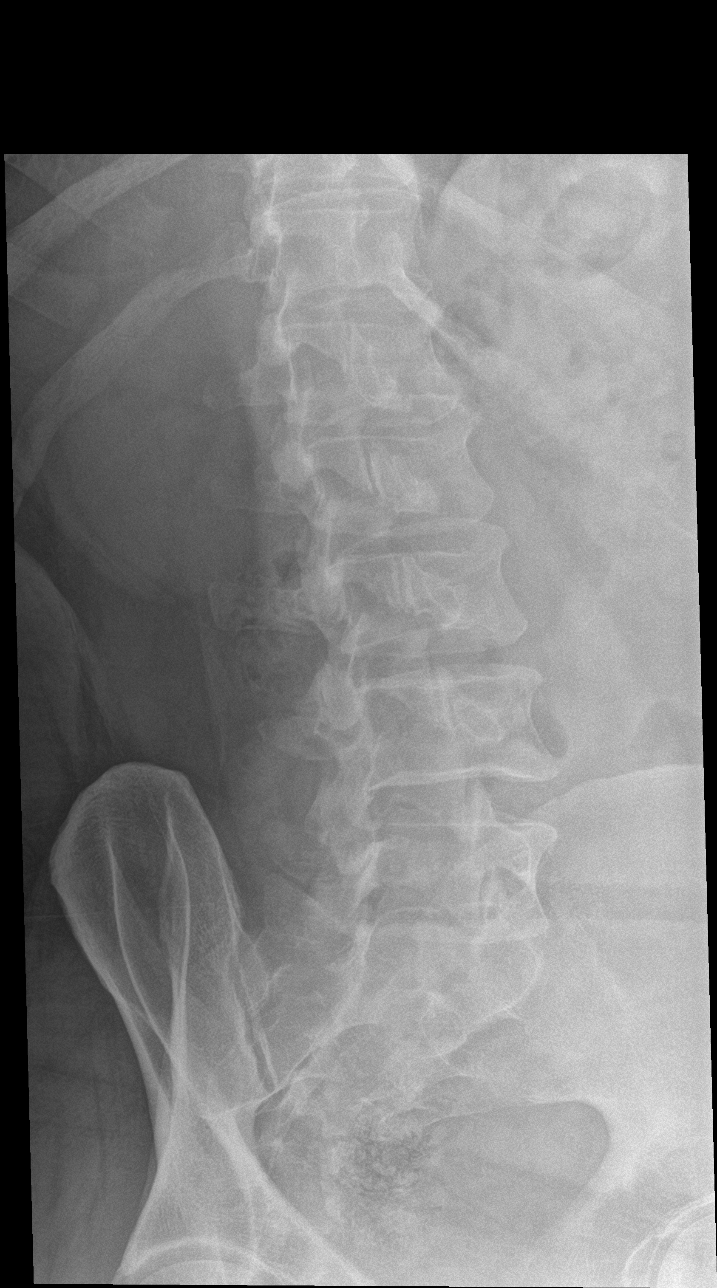

[l-spine lat]
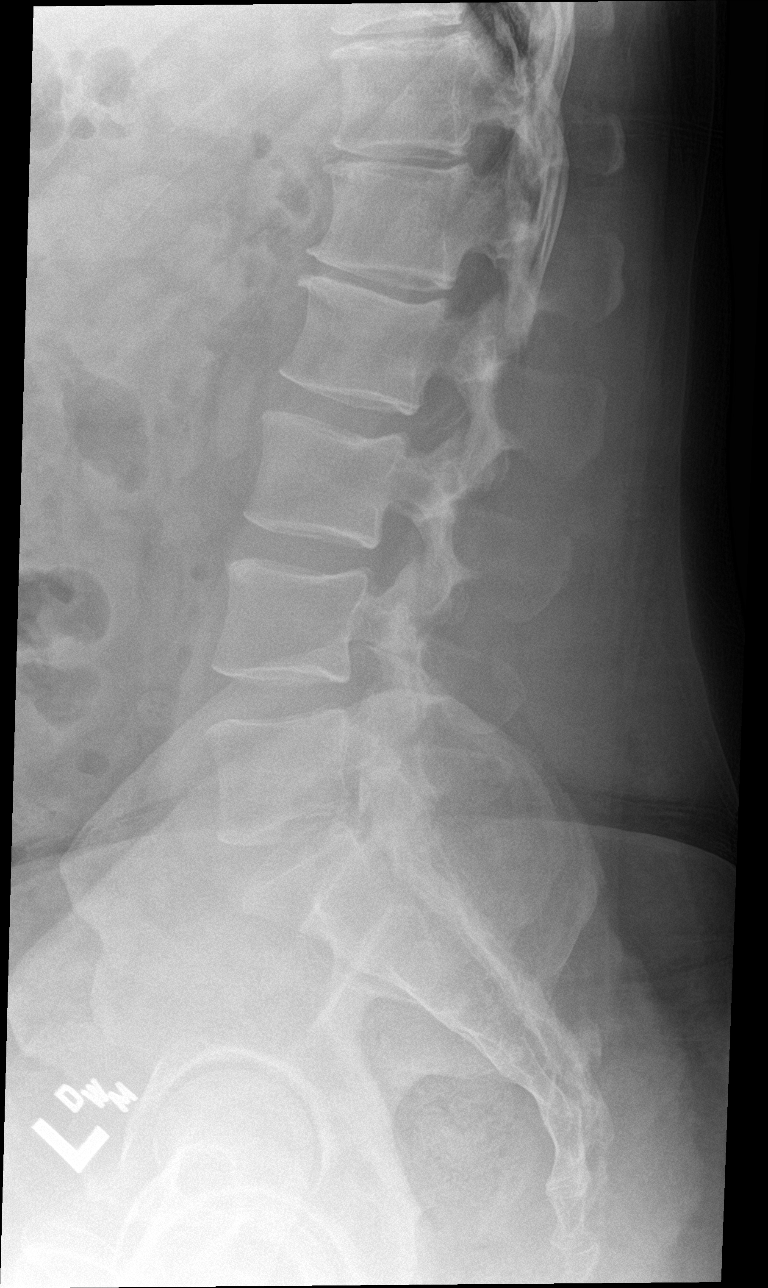

[l-spine spot]
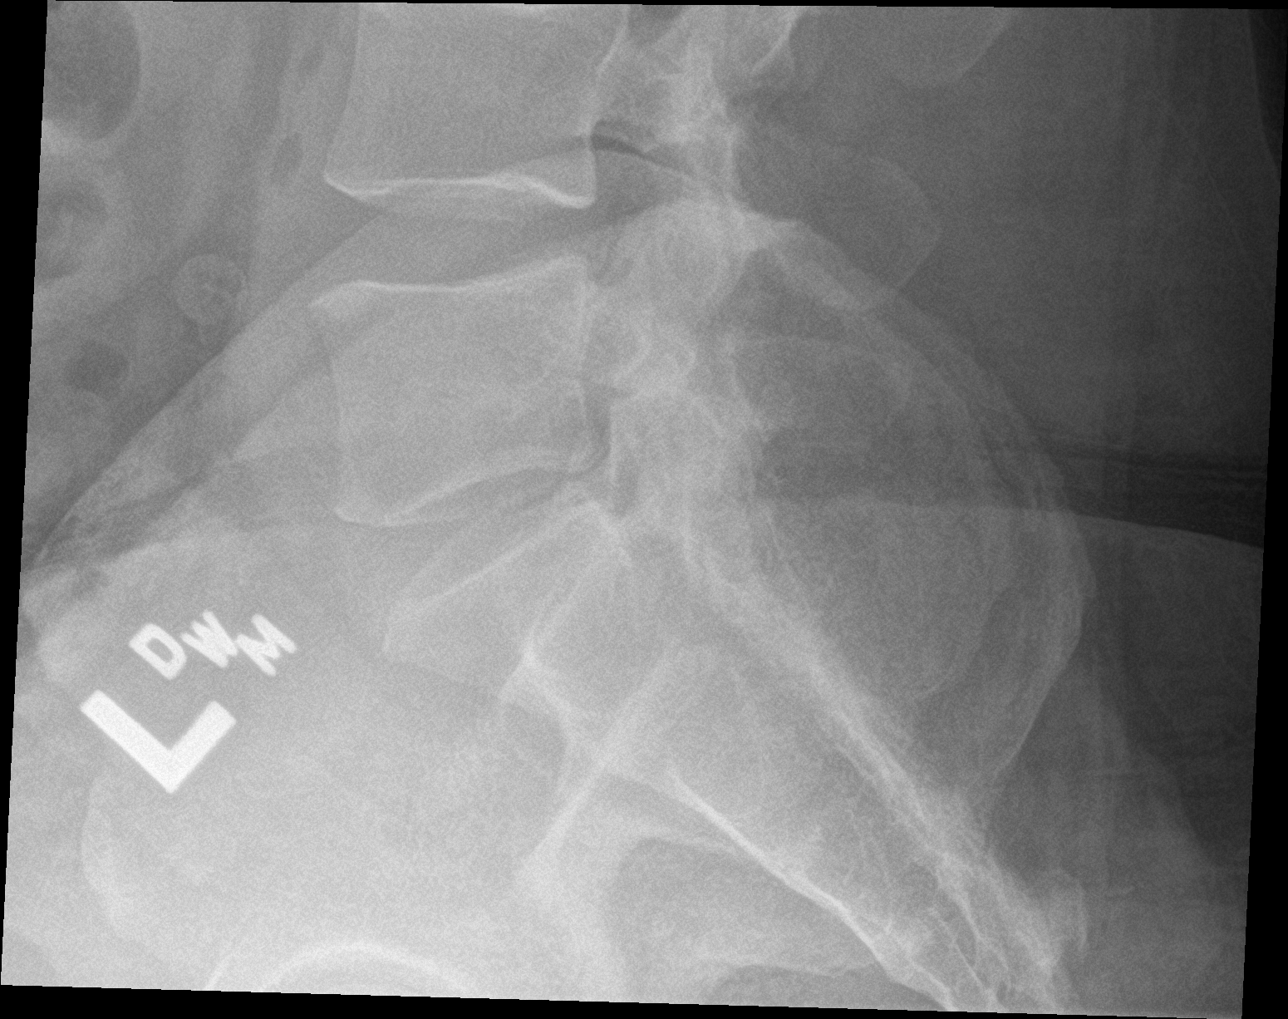

[5 of 5 positions shown; findings below may reference images not displayed]

FINDINGS: Five views of the lumbar spine submitted. No acute fracture or
subluxation. There is mild disc space flattening with anterior
spurring at T11-T12, T12-L1 and L1-L2 level. Alignment and vertebral
body heights are preserved. Mild disc space flattening at L5-S1
level. Mild facet degenerative changes L5 level.
IMPRESSION: No acute fracture or subluxation. Mild degenerative changes as
described above. Alignment and vertebral body heights are preserved.
